# Patient Record
Sex: Male | Born: 2000 | Race: Black or African American | Hispanic: No | Marital: Single | Smoking: Never smoker
Health system: Southern US, Community
[De-identification: ages and names within clinical notes are randomized; demographics above are authoritative.]

---
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
## 2022-02-10 ENCOUNTER — Emergency Department (HOSPITAL_COMMUNITY)
Admission: EM | Admit: 2022-02-10 | Discharge: 2022-02-11 | Discharge status: Against Medical Advice | Payer: Self-pay | Attending: Emergency Medicine | Admitting: Emergency Medicine

## 2022-02-10 ENCOUNTER — Other Ambulatory Visit: Payer: Self-pay

## 2022-02-10 ENCOUNTER — Emergency Department (HOSPITAL_COMMUNITY): Payer: Self-pay

## 2022-02-10 DIAGNOSIS — R0602 Shortness of breath: Secondary | ICD-10-CM | POA: Insufficient documentation

## 2022-02-10 DIAGNOSIS — R079 Chest pain, unspecified: Secondary | ICD-10-CM | POA: Insufficient documentation

## 2022-02-10 DIAGNOSIS — Z1152 Encounter for screening for COVID-19: Secondary | ICD-10-CM | POA: Insufficient documentation

## 2022-02-10 DIAGNOSIS — Z5321 Procedure and treatment not carried out due to patient leaving prior to being seen by health care provider: Secondary | ICD-10-CM | POA: Insufficient documentation

## 2022-02-10 LAB — CBC WITH DIFFERENTIAL/PLATELET
Abs Immature Granulocytes: 0.01 10*3/uL (ref 0.00–0.07)
Basophils Absolute: 0 10*3/uL (ref 0.0–0.1)
Basophils Relative: 1 %
Eosinophils Absolute: 0.3 10*3/uL (ref 0.0–0.5)
Eosinophils Relative: 4 %
HCT: 41.7 % (ref 39.0–52.0)
Hemoglobin: 13.9 g/dL (ref 13.0–17.0)
Immature Granulocytes: 0 %
Lymphocytes Relative: 43 %
Lymphs Abs: 2.9 10*3/uL (ref 0.7–4.0)
MCH: 27.5 pg (ref 26.0–34.0)
MCHC: 33.3 g/dL (ref 30.0–36.0)
MCV: 82.4 fL (ref 80.0–100.0)
Monocytes Absolute: 0.7 10*3/uL (ref 0.1–1.0)
Monocytes Relative: 10 %
Neutro Abs: 2.9 10*3/uL (ref 1.7–7.7)
Neutrophils Relative %: 42 %
Platelets: 266 10*3/uL (ref 150–400)
RBC: 5.06 MIL/uL (ref 4.22–5.81)
RDW: 13.3 % (ref 11.5–15.5)
WBC: 6.8 10*3/uL (ref 4.0–10.5)
nRBC: 0 % (ref 0.0–0.2)

## 2022-02-10 LAB — BASIC METABOLIC PANEL
Anion gap: 9 (ref 5–15)
BUN: 11 mg/dL (ref 6–20)
CO2: 25 mmol/L (ref 22–32)
Calcium: 9.7 mg/dL (ref 8.9–10.3)
Chloride: 104 mmol/L (ref 98–111)
Creatinine, Ser: 0.92 mg/dL (ref 0.61–1.24)
GFR, Estimated: >60 mL/min (ref >60)
Glucose, Bld: 89 mg/dL (ref 70–99)
Potassium: 3.3 mmol/L — ABNORMAL LOW (ref 3.5–5.1)
Sodium: 138 mmol/L (ref 135–145)

## 2022-02-10 LAB — RESP PANEL BY RT-PCR (RSV, FLU A&B, COVID)  RVPGX2
Influenza A by PCR: NEGATIVE
Influenza B by PCR: NEGATIVE
Resp Syncytial Virus by PCR: NEGATIVE
SARS Coronavirus 2 by RT PCR: NEGATIVE

## 2022-02-10 MED ORDER — IPRATROPIUM-ALBUTEROL 0.5-2.5 (3) MG/3ML IN SOLN
3.0000 mL | Freq: Once | RESPIRATORY_TRACT | Status: AC
  Administered 2022-02-10: 3 mL via RESPIRATORY_TRACT
  Filled 2022-02-10: qty 3

## 2022-02-10 NOTE — ED Triage Notes (Signed)
Pt reporting that since Monday, when takes a deep breath in, he has pain in the right chest. He denies fevers, cough, or injury. Has not been taking any medications for relief.

## 2022-02-10 NOTE — ED Provider Triage Note (Signed)
Emergency Medicine Provider Triage Evaluation Note  Lawrence Peters , a 22 y.o. male  was evaluated in triage.  Pt complains of shortness of breath for 3 days.  Reports tightness on the right side of his chest.  No cough, runny nose, fever.  Denies smoking.  No history of asthma.  Review of Systems  Positive: As above Negative: As above  Physical Exam  BP 116/77 (BP Location: Right Arm)   Pulse 68   Temp 98.1 F (36.7 C) (Oral)   Resp 19   SpO2 99%  Gen:   Awake, no distress   Resp:  Normal effort  MSK:   Moves extremities without difficulty  Other:    Medical Decision Making  Medically screening exam initiated at 8:53 PM.  Appropriate orders placed.  Kristine Tiley was informed that the remainder of the evaluation will be completed by another provider, this initial triage assessment does not replace that evaluation, and the importance of remaining in the ED until their evaluation is complete.    Rex Kras, [place] 02/10/22 2054

## 2022-03-23 ENCOUNTER — Ambulatory Visit: Payer: Self-pay | Admitting: Physician Assistant

## 2022-03-24 ENCOUNTER — Ambulatory Visit: Payer: Self-pay | Admitting: Nurse Practitioner

## 2022-04-14 ENCOUNTER — Encounter: Payer: Self-pay | Admitting: Physician Assistant

## 2022-04-14 ENCOUNTER — Ambulatory Visit (INDEPENDENT_AMBULATORY_CARE_PROVIDER_SITE_OTHER): Payer: Self-pay | Admitting: Physician Assistant

## 2022-04-14 VITALS — BP 134/80 | HR 64 | Temp 97.5°F | Ht 69.09 in | Wt 176.0 lb

## 2022-04-14 DIAGNOSIS — N5089 Other specified disorders of the male genital organs: Secondary | ICD-10-CM

## 2022-04-14 LAB — CBC WITH DIFFERENTIAL/PLATELET
Basophils Absolute: 0 10*3/uL (ref 0.0–0.1)
Basophils Relative: 0.5 % (ref 0.0–3.0)
Eosinophils Absolute: 0.3 10*3/uL (ref 0.0–0.7)
Eosinophils Relative: 6.4 % — ABNORMAL HIGH (ref 0.0–5.0)
HCT: 43.3 % (ref 39.0–52.0)
Hemoglobin: 14.3 g/dL (ref 13.0–17.0)
Lymphocytes Relative: 36.6 % (ref 12.0–46.0)
Lymphs Abs: 1.6 10*3/uL (ref 0.7–4.0)
MCHC: 33 g/dL (ref 30.0–36.0)
MCV: 81.9 fl (ref 78.0–100.0)
Monocytes Absolute: 0.5 10*3/uL (ref 0.1–1.0)
Monocytes Relative: 10.7 % (ref 3.0–12.0)
Neutro Abs: 2 10*3/uL (ref 1.4–7.7)
Neutrophils Relative %: 45.8 % (ref 43.0–77.0)
Platelets: 285 10*3/uL (ref 150.0–400.0)
RBC: 5.29 Mil/uL (ref 4.22–5.81)
RDW: 13.8 % (ref 11.5–15.5)
WBC: 4.3 10*3/uL (ref 4.0–10.5)

## 2022-04-14 LAB — COMPREHENSIVE METABOLIC PANEL
ALT: 17 U/L (ref 0–53)
AST: 23 U/L (ref 0–37)
Albumin: 4.7 g/dL (ref 3.5–5.2)
Alkaline Phosphatase: 59 U/L (ref 39–117)
BUN: 10 mg/dL (ref 6–23)
CO2: 29 mEq/L (ref 19–32)
Calcium: 9.8 mg/dL (ref 8.4–10.5)
Chloride: 102 mEq/L (ref 96–112)
Creatinine, Ser: 0.86 mg/dL (ref 0.40–1.50)
GFR: 123.15 mL/min (ref >60.00)
Glucose, Bld: 99 mg/dL (ref 70–99)
Potassium: 4.1 mEq/L (ref 3.5–5.1)
Sodium: 138 mEq/L (ref 135–145)
Total Bilirubin: 0.5 mg/dL (ref 0.2–1.2)
Total Protein: 7.5 g/dL (ref 6.0–8.3)

## 2022-04-14 NOTE — Progress Notes (Signed)
Subjective:    Patient ID: Lawrence Peters, male    DOB: [birth year], 22 y.o.   MRN: [id number]  Chief Complaint  Patient presents with   New Patient (Initial Visit)    New pt in office to est care with PCP; pt not seen a PCP, pt has having issues breathing since November of last year; pt states never dx with asthma; also mass on right testicle not painful just uncomfortable, can push mass in and disappears but when coughing it comes back. Not sure how long it has been present, first noticed a yr ago and it was smaller thought it would go away but as since gotten bigger in size.     HPI 22 y.o. patient presents today for new patient establishment with me.    Acute Concerns: Right testicular mass -  growing over the last year; uncomfortable, but not painful, feels a mass on his testicle; feels like it's affecting his breathing at work sometimes (works as a Development worker, international aid). Denies any hx of STI. No fever, chills, wt loss, night sweats, or other concerns.    History reviewed. No pertinent past medical history.  History reviewed. No pertinent surgical history.  Family History  Problem Relation Age of Onset   Healthy Mother    Healthy Father    Leukemia Brother    Asthma Brother    Asthma Brother     Social History   Tobacco Use   Smoking status: Never    Passive exposure: Yes   Smokeless tobacco: Never  Vaping Use   Vaping Use: Never used  Substance Use Topics   Alcohol use: Not Currently   Drug use: Yes    Types: Marijuana     No Known Allergies  Review of Systems NEGATIVE UNLESS OTHERWISE INDICATED IN HPI      Objective:     BP 134/80 (BP Location: Left Arm)   Pulse 64   Temp (!) 97.5 F (36.4 C) (Temporal)   Ht 5' 9.09" (1.755 m)   Wt 176 lb (79.8 kg)   SpO2 98%   BMI 25.92 kg/m   Wt Readings from Last 3 Encounters:  04/14/22 176 lb (79.8 kg)  02/05/15 130 lb 12.8 oz (59.3 kg) (61 %, Z= 0.27)*  11/09/11 79 lb 5.9 oz (36 kg) (31 %, Z= -0.49)*   * Growth  percentiles are based on CDC (Boys, 2-20 Years) data.    BP Readings from Last 3 Encounters:  04/14/22 134/80  02/10/22 116/77  02/05/15 118/55     Physical Exam Vitals and nursing note reviewed. Exam conducted with a chaperone present.  Constitutional:      Appearance: Normal appearance.  Cardiovascular:     Rate and Rhythm: Normal rate and regular rhythm.     Pulses: Normal pulses.     Heart sounds: No murmur heard. Pulmonary:     Effort: Pulmonary effort is normal. No respiratory distress.     Breath sounds: Normal breath sounds. No wheezing.  Genitourinary:    Penis: Normal and circumcised.      Testes:        Right: Mass, swelling (large grapefruit sized swelling R scrotum) and testicular hydrocele present. Tenderness not present.        Left: Mass, tenderness or swelling not present.  Neurological:     Mental Status: He is alert.        Assessment & Plan:  Mass of right testicle -     Ambulatory referral to Urology -  CBC with Differential/Platelet -     Comprehensive metabolic panel -     US SCROTUM W/DOPPLER; Future  Swelling of right testicle -     Ambulatory referral to Urology -     CBC with Differential/Platelet -     Comprehensive metabolic panel -     US SCROTUM W/DOPPLER; Future   Pleasant new pt establishing care. Suspect probable hydrocele R scrotum. He also has felt a testicular mass, but I did not palpate anything like that on exam. Plan for referral to urology and scrotal US at this time. Labs today.  ED precautions advised.      Return in about 4 weeks (around 05/12/2022) for recheck .    Vanesha Athens M Adeleine Pask, PA-C

## 2022-04-14 NOTE — Patient Instructions (Addendum)
Welcome to [facility]! It was a pleasure meeting you today.  As discussed, Please schedule a 1 month follow up visit today.  Urgent referral sent to urology. Please call to schedule with them - [phone] Labs today You will also be contacted to set up and US of the scrotum   PLEASE NOTE:  If you had any LAB tests please let us know if you have not heard back within a few days. You may see your results on MyChart before we have a chance to review them but we will give you a call once they are reviewed by [place]. If we ordered any REFERRALS today, please let us know if you have not heard from their office within the next two weeks. Let us know through MyChart if you are needing REFILLS, or have your pharmacy send [place] the request. You can also use MyChart to communicate with me or any office staff.  Please try these tips to maintain a healthy lifestyle:  Eat most of your calories during the day when you are active. Eliminate processed foods including packaged sweets (pies, cakes, cookies), reduce intake of potatoes, white bread, white pasta, and white rice. Look for whole grain options, oat flour or almond flour.  Each meal should contain half fruits/vegetables, one quarter protein, and one quarter carbs (no bigger than a computer mouse).  Cut down on sweet beverages. This includes juice, soda, and sweet tea. Also watch fruit intake, though this is a healthier sweet option, it still contains natural sugar! Limit to 3 servings daily.  Drink at least 1 glass of water with each meal and aim for at least 8 glasses (64 ounces) per day.  Exercise at least 150 minutes every week to the best of your ability.    Take Care,  Koua Deeg, PA-C

## 2022-04-22 ENCOUNTER — Ambulatory Visit (HOSPITAL_COMMUNITY)
Admission: RE | Admit: 2022-04-22 | Discharge: 2022-04-22 | Disposition: A | Discharge status: Home / Self Care | Payer: Self-pay | Source: Ambulatory Visit | Attending: Physician Assistant | Admitting: Physician Assistant

## 2022-04-22 DIAGNOSIS — N5089 Other specified disorders of the male genital organs: Secondary | ICD-10-CM | POA: Insufficient documentation

## 2022-04-25 NOTE — Progress Notes (Signed)
Noted, agreed

## 2022-05-16 ENCOUNTER — Ambulatory Visit (INDEPENDENT_AMBULATORY_CARE_PROVIDER_SITE_OTHER): Payer: Self-pay | Admitting: Physician Assistant

## 2022-05-16 VITALS — BP 126/76 | HR 67 | Temp 97.7°F | Ht 69.0 in | Wt 182.4 lb

## 2022-05-16 DIAGNOSIS — N5089 Other specified disorders of the male genital organs: Secondary | ICD-10-CM

## 2022-05-16 NOTE — Patient Instructions (Signed)
You have been referred to [facility].  You should be contacted soon for an appointment. If you do not hear back this week, please reach out to the following locations for an appointment:  [facility] [place]: [phone] [facility] [place]: [phone] [facility] [place]: [phone] [facility] [place]: [phone]  If you still cannot be seen in the next 3-4 weeks, let me know and a CT scan of abdomen and pelvis will be ordered.  Thank you!

## 2022-05-16 NOTE — Progress Notes (Signed)
Subjective:    Patient ID: Lawrence Peters, male    DOB: 01/24/2000, 22 y.o.   MRN: [id number]  Chief Complaint  Patient presents with   Mass    Pt in office to recheck right testicle mass; pt states it's now a little more uncomfortable; [facility] wouldn't accept pt insurance to schedule appt.     HPI Patient is in today for recheck on groin discomfort / testicular swelling.  Couldn't see [facility] because they didn't accept [place] Medicaid insurance.  Feels more uncomfortable, worse with standing often, feels like swelling is larger than prior check on the R side. No issues on the left side.   Still working in Aeronautical engineer.  No urinary issues. No burning, no pain, no double stream. Denies any abdominal pain or other symptoms. No issues with ED.   US scrotum with doppler was read as normal on 04/22/22.   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 Denies any hx of STI. No fever, chills, wt loss, night sweats, or other concerns.   No past medical history on file.  No past surgical history on file.  Family History  Problem Relation Age of Onset   Healthy Mother    Healthy Father    Leukemia Brother    Asthma Brother    Asthma Brother     Social History   Tobacco Use   Smoking status: Never    Passive exposure: Yes   Smokeless tobacco: Never  Vaping Use   Vaping Use: Never used  Substance Use Topics   Alcohol use: Not Currently   Drug use: Yes    Types: Marijuana     Allergies  Allergen Reactions   Shellfish Allergy Hives and Swelling    Review of Systems NEGATIVE UNLESS OTHERWISE INDICATED IN HPI      Objective:     BP 126/76 (BP Location: Left Arm)   Pulse 67   Temp 97.7 F (36.5 C) (Temporal)   Ht 5\' 9"  (1.753 m)   Wt 182 lb 6.4 oz (82.7 kg)   SpO2 98%   BMI 26.94 kg/m   Wt  Readings from Last 3 Encounters:  05/16/22 182 lb 6.4 oz (82.7 kg)  04/14/22 176 lb (79.8 kg)  02/05/15 130 lb 12.8 oz (59.3 kg) (61 %, Z= 0.27)*   * Growth percentiles are based on CDC (Boys, 2-20 Years) data.    BP Readings from Last 3 Encounters:  05/16/22 126/76  04/14/22 134/80  02/10/22 116/77     Physical Exam Vitals and nursing note reviewed. Exam conducted with a chaperone present.  Constitutional:      Appearance: Normal appearance.  Cardiovascular:     Rate and Rhythm: Normal rate and regular rhythm.     Pulses: Normal pulses.     Heart sounds: No murmur heard. Pulmonary:     Effort: Pulmonary effort is normal. No respiratory distress.     Breath sounds: Normal breath sounds. No wheezing.  Genitourinary:    Penis: Normal and circumcised.      Testes:        Right: Mass and swelling (large grapefruit sized swelling R scrotum) present. Tenderness not present.        Left: Mass, tenderness or swelling not present.  Neurological:     Mental Status: He is alert.  Assessment & Plan:  Swelling of right testicle -     Ambulatory referral to Urology   Evaluated again with Dr. Jimmey Ralph today in office. Appearance about the same as last visit. [place] read as negative. Still experiencing swelling and discomfort, but no pain. Will send new referral to [facility]. AVS provided with additional info. Consider CT abd / pelvis.      Return in about 4 months (around 09/15/2022) for physical.     Arzell Mcgeehan M Mekia Dipinto, PA-C

## 2022-05-26 ENCOUNTER — Encounter: Payer: Self-pay | Admitting: Urology

## 2022-05-26 ENCOUNTER — Ambulatory Visit: Payer: Self-pay | Admitting: Urology

## 2022-05-26 VITALS — BP 147/71 | HR 72 | Ht 69.0 in | Wt 170.0 lb

## 2022-05-26 DIAGNOSIS — K4090 Unilateral inguinal hernia, without obstruction or gangrene, not specified as recurrent: Secondary | ICD-10-CM

## 2022-05-26 NOTE — Progress Notes (Signed)
   Assessment: 1. Inguinal hernia of left side without obstruction or gangrene      Plan: General surgery referral for hernia repair  Chief Complaint: right scrotal mass  History of Present Illness:  Lawrence Peters is a 22 y.o. male who is seen in consultation from [place], Crist Infante, PA-C for evaluation of right scrotal mass c/w reducible inguinal hernia. On exam hernia is fairly sizeable but completely reducible and nontender. Patient has noticed it for about 1 year. A scrotal ultrasound was obtained which demonstrates normal testes and epididymis bilaterally.  No evidence of any mass or microlithiasis appreciated.  See full report below.  Past Medical History:  No past medical history on file.  Past Surgical History:  No past surgical history on file.  Allergies:  Allergies  Allergen Reactions   Shellfish Allergy Hives and Swelling    Family History:  Family History  Problem Relation Age of Onset   Healthy Mother    Healthy Father    Leukemia Brother    Asthma Brother    Asthma Brother     Social History:  Social History   Tobacco Use   Smoking status: Never    Passive exposure: Yes   Smokeless tobacco: Never  Vaping Use   Vaping Use: Never used  Substance Use Topics   Alcohol use: Not Currently   Drug use: Yes    Types: Marijuana    Review of symptoms:  Constitutional:  Negative for unexplained weight loss, night sweats, fever, chills ENT:  Negative for nose bleeds, sinus pain, painful swallowing CV:  Negative for chest pain, shortness of breath, exercise intolerance, palpitations, loss of consciousness Resp:  Negative for cough, wheezing, shortness of breath GI:  Negative for nausea, vomiting, diarrhea, bloody stools GU:  Positives noted in HPI; otherwise negative for gross hematuria, dysuria, urinary incontinence Neuro:  Negative for seizures, poor balance, limb weakness, slurred speech Psych:  Negative for lack of energy, depression,  anxiety Endocrine:  Negative for polydipsia, polyuria, symptoms of hypoglycemia (dizziness, hunger, sweating) Hematologic:  Negative for anemia, purpura, petechia, prolonged or excessive bleeding, use of anticoagulants  Allergic:  Negative for difficulty breathing or choking as a result of exposure to anything; no shellfish allergy; no allergic response (rash/itch) to materials, foods  Physical exam: BP (!) 147/71   Pulse 72   Ht 5\' 9"  (1.753 m)   Wt 170 lb (77.1 kg)   BMI 25.10 kg/m  GENERAL APPEARANCE:  Well appearing, well developed, well nourished, NAD  GU:  nl phallus, testes and cords. Right fully reducible inguinal hernia   Results: CLINICAL DATA:  Painless swelling of right testicle   EXAM: SCROTAL ULTRASOUND  04/22/2022   DOPPLER ULTRASOUND OF THE TESTICLES   TECHNIQUE: Complete ultrasound examination of the testicles, epididymis, and other scrotal structures was performed. Color and spectral Doppler ultrasound were also utilized to evaluate blood flow to the testicles.   COMPARISON:  None Available.   FINDINGS: Right testicle   Measurements: 3.9 x 3 x 3.6 cm. No mass or microlithiasis visualized.   Left testicle   Measurements: 4.4 x 2.3 x 3.2 cm. No mass or microlithiasis visualized.   Right epididymis:  Normal in size and appearance.   Left epididymis:  Normal in size and appearance.   Hydrocele:  None visualized.   Varicocele:  None visualized.   Pulsed Doppler interrogation of both testes demonstrates normal low resistance arterial and venous waveforms bilaterally.   IMPRESSION: Negative examination.

## 2022-05-30 LAB — URINALYSIS, ROUTINE W REFLEX MICROSCOPIC
Bilirubin, UA: NEGATIVE
Glucose, UA: NEGATIVE
Ketones, UA: NEGATIVE
Leukocytes,UA: NEGATIVE
Nitrite, UA: NEGATIVE
Protein,UA: NEGATIVE
Specific Gravity, UA: 1.02 (ref 1.005–1.030)
Urobilinogen, Ur: 0.2 mg/dL (ref 0.2–1.0)
pH, UA: 7 (ref 5.0–7.5)

## 2022-05-30 LAB — MICROSCOPIC EXAMINATION
Cast Type: NONE SEEN
Casts: NONE SEEN /lpf
Crystal Type: NONE SEEN
Crystals: NONE SEEN
Epithelial Cells (non renal): NONE SEEN /hpf (ref 0–10)
Mucus, UA: NONE SEEN
Renal Epithel, UA: NONE SEEN /hpf
Trichomonas, UA: NONE SEEN
Yeast, UA: NONE SEEN

## 2023-02-23 ENCOUNTER — Ambulatory Visit: Payer: 59 | Admitting: Urology

## 2023-02-23 ENCOUNTER — Encounter: Payer: Self-pay | Admitting: Urology

## 2023-02-23 VITALS — BP 134/75 | HR 73 | Ht 69.5 in | Wt 188.0 lb

## 2023-02-23 DIAGNOSIS — K4090 Unilateral inguinal hernia, without obstruction or gangrene, not specified as recurrent: Secondary | ICD-10-CM

## 2023-02-23 NOTE — Progress Notes (Signed)
   Assessment: 1. Inguinal hernia of right side without obstruction or gangrene     Plan: Will again refer to surgery for hernia repair.  Chief Complaint: Inguinal hernia  HPI: Lawrence Peters is a 23 y.o. male who presents for continued evaluation of a very large right inguinal hernia which nearly fills his right hemiscrotum.  On examination today it is still reducible.  Please see my note 05/26/2022 to time of initial visit.  At that time the patient was referred to general surgery for hernia repair but apparently did not have insurance.  He again presents here today for follow-up.SABRA   Portions of the above documentation were copied from a prior visit for review purposes only.  Allergies: Allergies  Allergen Reactions   Shellfish Allergy Hives and Swelling    PMH: No past medical history on file.  PSH: No past surgical history on file.  SH: Social History   Tobacco Use   Smoking status: Never    Passive exposure: Yes   Smokeless tobacco: Never  Vaping Use   Vaping status: Never Used  Substance Use Topics   Alcohol use: Not Currently   Drug use: Yes    Types: Marijuana    ROS: Constitutional:  Negative for fever, chills, weight loss CV: Negative for chest pain, previous MI, hypertension Respiratory:  Negative for shortness of breath, wheezing, sleep apnea, frequent cough GI:  Negative for nausea, vomiting, bloody stool, GERD  PE: BP 134/75   Pulse 73   Ht 5' 9.5 (1.765 m)   Wt 188 lb (85.3 kg)   BMI 27.36 kg/m  GENERAL APPEARANCE:  Well appearing, well developed, well nourished, NAD  GU: Normal phallus left testis and cord.  Normally palpable right testis with large left inguinal hernia.  With the patient supine and with gentle pressure I am able to reduce the hernia.  It is not tender.

## 2023-05-12 ENCOUNTER — Other Ambulatory Visit: Payer: Self-pay

## 2023-05-12 ENCOUNTER — Emergency Department (HOSPITAL_COMMUNITY)
Admission: EM | Admit: 2023-05-12 | Discharge: 2023-05-13 | Discharge status: Against Medical Advice | Attending: Emergency Medicine | Admitting: Emergency Medicine

## 2023-05-12 ENCOUNTER — Emergency Department (HOSPITAL_COMMUNITY)

## 2023-05-12 DIAGNOSIS — Z5321 Procedure and treatment not carried out due to patient leaving prior to being seen by health care provider: Secondary | ICD-10-CM | POA: Insufficient documentation

## 2023-05-12 DIAGNOSIS — R0602 Shortness of breath: Secondary | ICD-10-CM | POA: Insufficient documentation

## 2023-05-12 DIAGNOSIS — R112 Nausea with vomiting, unspecified: Secondary | ICD-10-CM | POA: Insufficient documentation

## 2023-05-12 DIAGNOSIS — R079 Chest pain, unspecified: Secondary | ICD-10-CM | POA: Diagnosis present

## 2023-05-12 LAB — BASIC METABOLIC PANEL WITH GFR
Anion gap: 10 (ref 5–15)
BUN: 15 mg/dL (ref 6–20)
CO2: 22 mmol/L (ref 22–32)
Calcium: 9.3 mg/dL (ref 8.9–10.3)
Chloride: 106 mmol/L (ref 98–111)
Creatinine, Ser: 0.84 mg/dL (ref 0.61–1.24)
GFR, Estimated: >60 mL/min (ref >60)
Glucose, Bld: 107 mg/dL — ABNORMAL HIGH (ref 70–99)
Potassium: 3.4 mmol/L — ABNORMAL LOW (ref 3.5–5.1)
Sodium: 138 mmol/L (ref 135–145)

## 2023-05-12 LAB — CBC
HCT: 41.5 % (ref 39.0–52.0)
Hemoglobin: 13.3 g/dL (ref 13.0–17.0)
MCH: 26.6 pg (ref 26.0–34.0)
MCHC: 32 g/dL (ref 30.0–36.0)
MCV: 83 fL (ref 80.0–100.0)
Platelets: 297 10*3/uL (ref 150–400)
RBC: 5 MIL/uL (ref 4.22–5.81)
RDW: 13.5 % (ref 11.5–15.5)
WBC: 7.5 10*3/uL (ref 4.0–10.5)
nRBC: 0 % (ref 0.0–0.2)

## 2023-05-12 LAB — LIPASE, BLOOD: Lipase: 25 U/L (ref 11–51)

## 2023-05-12 LAB — TROPONIN I (HIGH SENSITIVITY): Troponin I (High Sensitivity): <2 ng/L (ref <18)

## 2023-05-12 NOTE — ED Triage Notes (Signed)
 Pt c/o persistent centralized chest pain that radiates to the back over the past 2 days. Worsened today 30 min after dinner with 1 episode of NV. CP associated with SOB. No known cardiac hx. Does endorse THC use today.

## 2023-05-13 NOTE — ED Notes (Signed)
Pt informed staff he was leaving.

## 2023-05-13 NOTE — ED Notes (Signed)
 Patient and friend were walking out of the ED, security asked if they were leaving and they answered that yes we are.

## 2023-05-29 ENCOUNTER — Ambulatory Visit (HOSPITAL_COMMUNITY): Admit: 2023-05-29 | Admitting: General Surgery

## 2023-05-29 SURGERY — REPAIR, HERNIA, INGUINAL, ADULT
Anesthesia: General | Laterality: Right
